# Patient Record
Sex: Male | Born: 1990 | Race: White | Hispanic: No | Marital: Married
Health system: Southern US, Community
[De-identification: ages and names within clinical notes are randomized; demographics above are authoritative.]

---

## 2021-11-01 ENCOUNTER — Other Ambulatory Visit: Payer: Self-pay | Admitting: Family Medicine

## 2021-11-01 DIAGNOSIS — R519 Headache, unspecified: Secondary | ICD-10-CM

## 2021-11-06 ENCOUNTER — Ambulatory Visit
Admission: RE | Admit: 2021-11-06 | Discharge: 2021-11-06 | Disposition: A | Discharge status: Home / Self Care | Payer: BC Managed Care – PPO | Source: Ambulatory Visit | Attending: Family Medicine | Admitting: Family Medicine

## 2021-11-06 DIAGNOSIS — R519 Headache, unspecified: Secondary | ICD-10-CM

## 2021-11-06 IMAGING — MR MR HEAD WO/W CM
13 series · 48 of 48 positions shown · IV contrast (multihance)
Comparison: None Available.

CLINICAL DATA: Non intractable headache, unspecified chronicity and
pattern; technologist note states sinus pressure, headaches, visual
disturbance

EXAM:
MRI HEAD AND ORBITS WITHOUT AND WITH CONTRAST
TECHNIQUE: Multiplanar, multiecho pulse sequences of the brain and surrounding
structures were obtained without and with intravenous contrast.
Multiplanar, multiecho pulse sequences of the orbits and surrounding
structures were obtained including fat saturation techniques, before
and after intravenous contrast administration.
CONTRAST:  14mL MULTIHANCE GADOBENATE DIMEGLUMINE 529 MG/ML IV SOLN

[Series 2: T1 · sagittal · 5.0mm · 0.45mm/px · 2 of 23 slices shown]
[im 1/23]
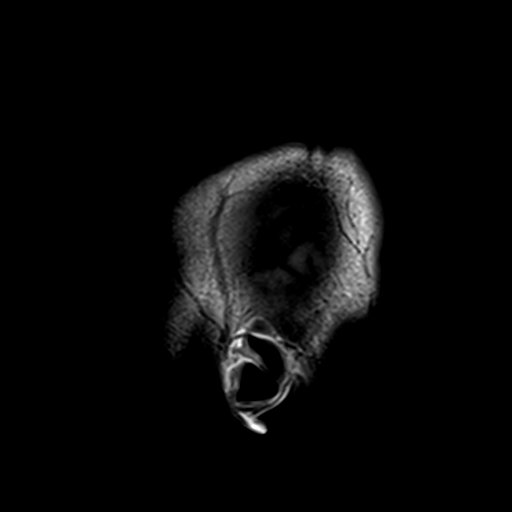
[im 23/23]
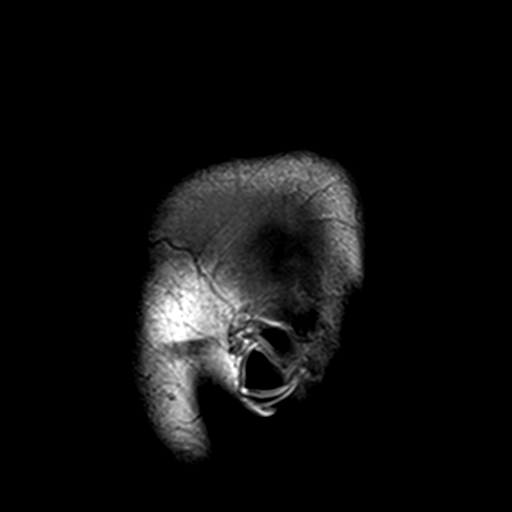

[Series 3: DWI · axial · 3.0mm · 1.80mm/px · z∈[-63,+89]mm · 7 of 103 slices shown (1 of 4)]
[im 1/103]
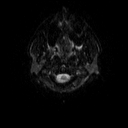
[im 18/103]
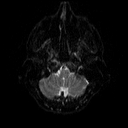
[im 35/103]
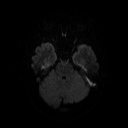
[im 52/103]
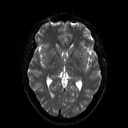
[im 69/103]
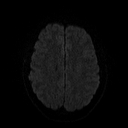
[im 86/103]
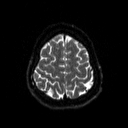
[im 103/103]
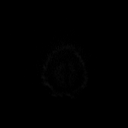

[Series 4: DWI · axial · 3.0mm · 1.80mm/px · z∈[-63,+89]mm · 3 of 52 slices shown (2 of 4)]
[im 1/52]
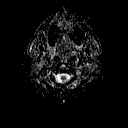
[im 26/52]
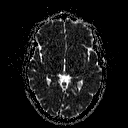
[im 52/52]
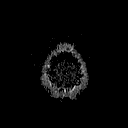

[Series 5: DWI · coronal · 5.0mm · 1.80mm/px · 5 of 76 slices shown (3 of 4)]
[im 1/76]
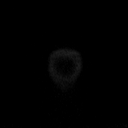
[im 19/76]
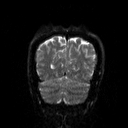
[im 38/76]
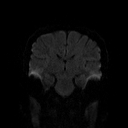
[im 57/76]
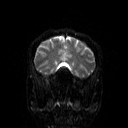
[im 76/76]
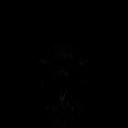

[Series 6: DWI · coronal · 5.0mm · 1.80mm/px · 2 of 38 slices shown (4 of 4)]
[im 1/38]
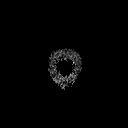
[im 38/38]
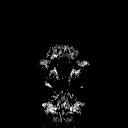

[Series 7: T2 · axial · 5.0mm · 0.72mm/px · 1 of 23 slices shown (1 of 2)]
[im 1/23]
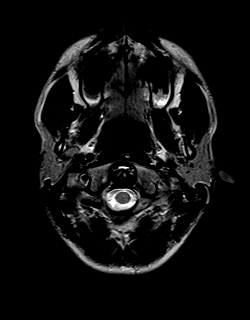

[Series 8: FLAIR · axial · 3.0mm · 0.45mm/px · z∈[-70,+88]mm · 2 of 35 slices shown (1 of 2)]
[im 1/35]
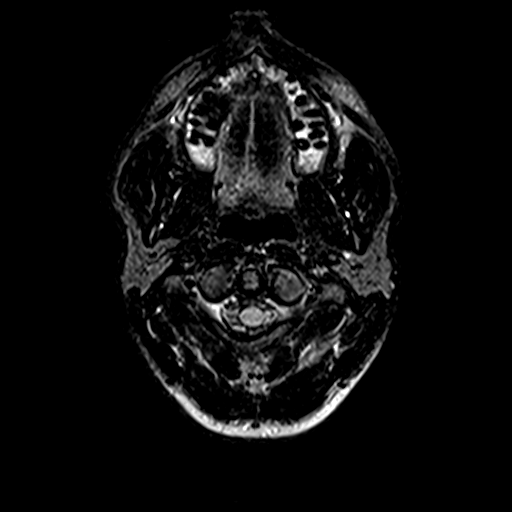
[im 35/35]
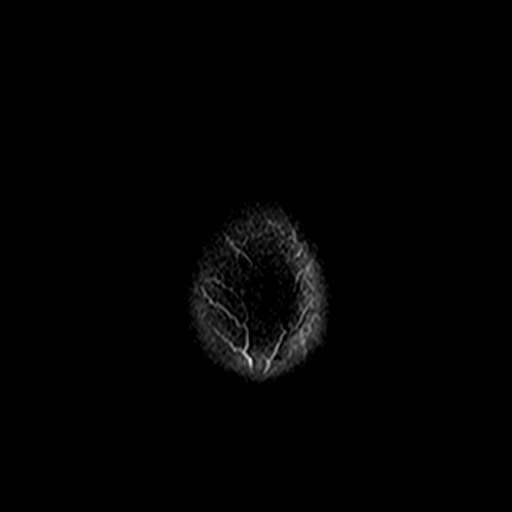

[Series 10: swi_images · axial · 4.0mm · 0.90mm/px · z∈[-61,+79]mm · 2 of 36 slices shown]
[im 1/36]
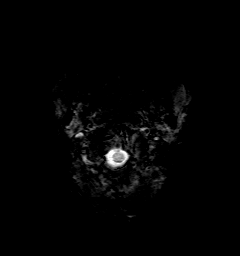
[im 36/36]
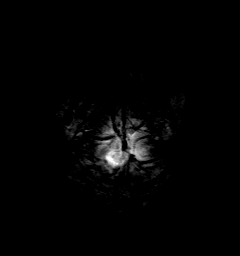

[Series 11: FLAIR · sagittal · 5.0mm · 0.45mm/px · 2 of 26 slices shown (2 of 2)]
[im 1/26]
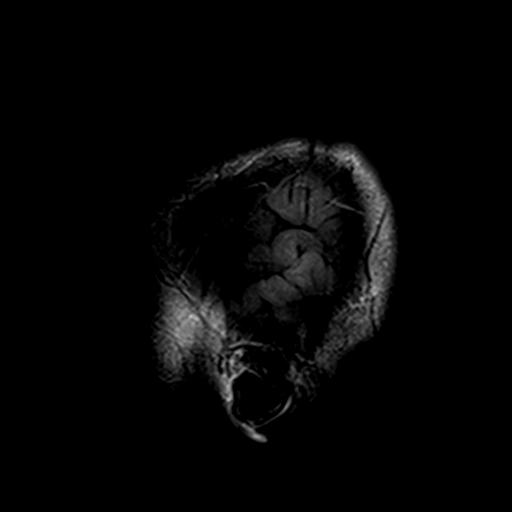
[im 26/26]
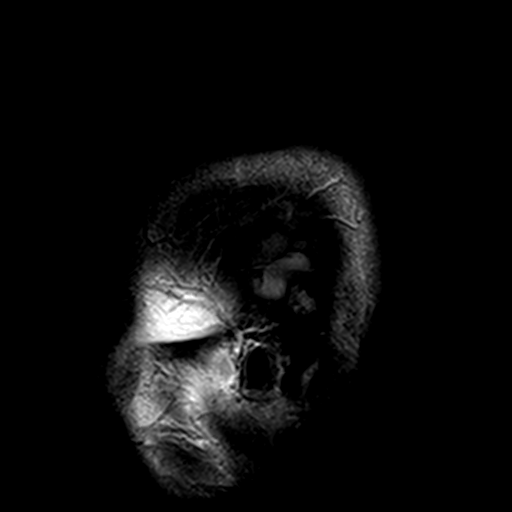

[Series 12: t1_mpr_tra · axial · 1.0mm · 0.75mm/px · z∈[-63,+80]mm · 9 of 144 slices shown]
[im 1/144]
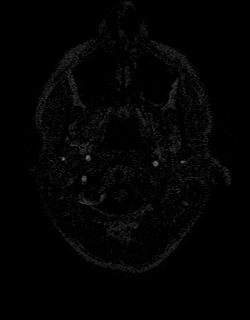
[im 18/144]
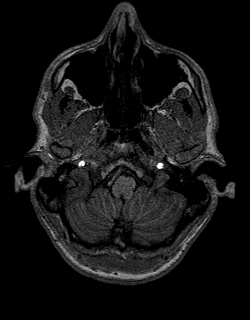
[im 36/144]
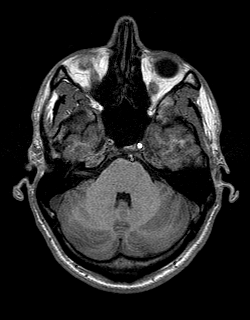
[im 54/144]
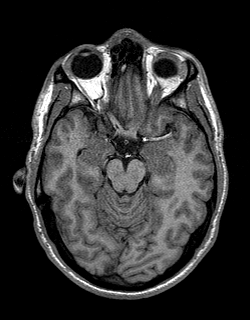
[im 72/144]
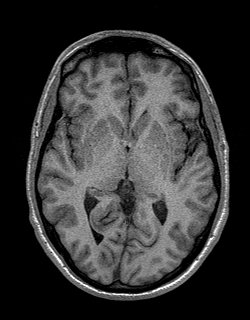
[im 90/144]
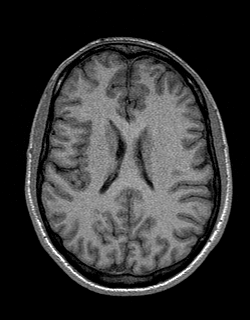
[im 108/144]
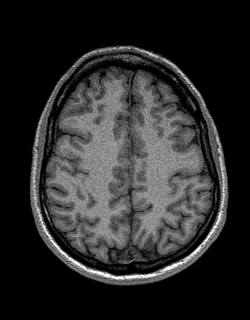
[im 126/144]
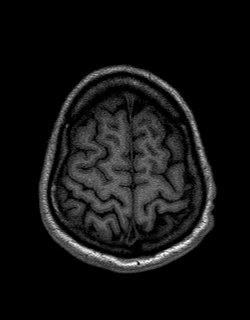
[im 144/144]
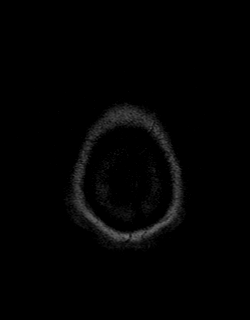

[Series 13: T2 · coronal · 5.0mm · 0.45mm/px · 2 of 29 slices shown (2 of 2)]
[im 1/29]
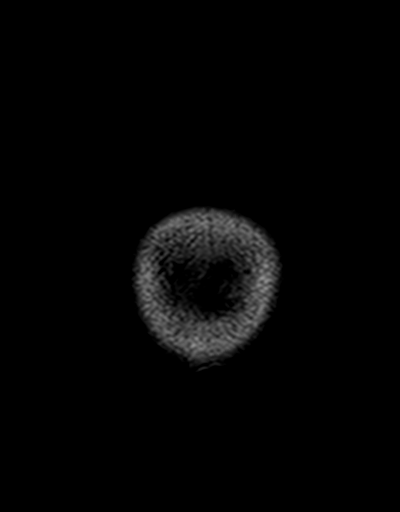
[im 29/29]
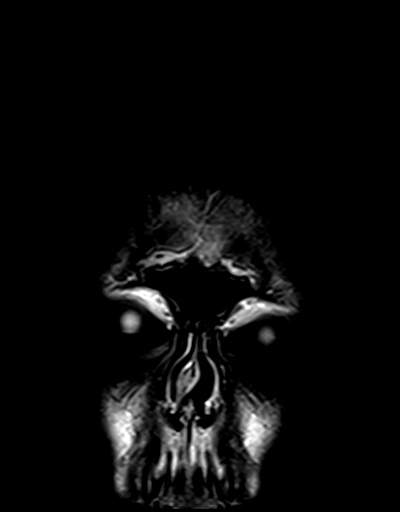

[Series 14: t1_mpr_tra post · axial · 1.0mm · 0.75mm/px · z∈[-63,+80]mm · 9 of 144 slices shown]
[im 1/144]
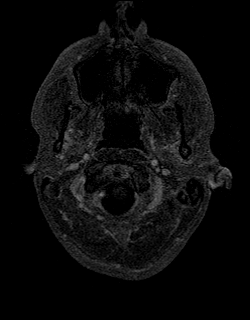
[im 18/144]
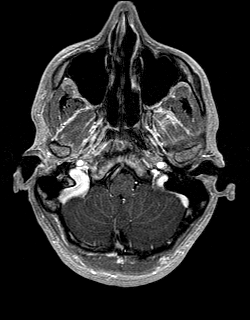
[im 36/144]
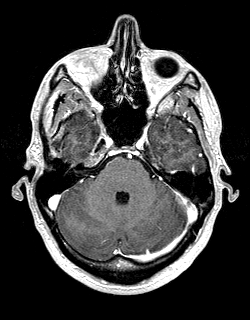
[im 54/144]
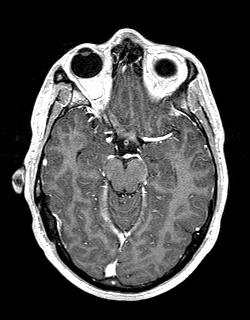
[im 72/144]
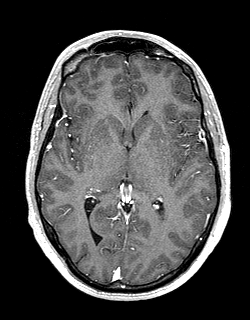
[im 90/144]
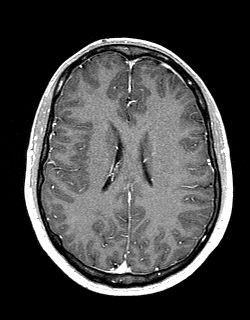
[im 108/144]
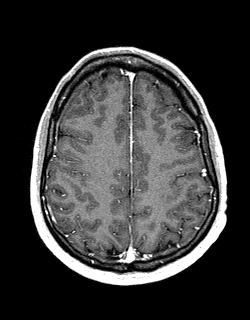
[im 126/144]
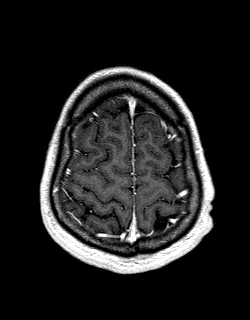
[im 144/144]
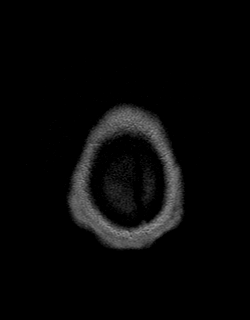

[Series 15: post cor · coronal · 5.0mm · 0.45mm/px · 2 of 29 slices shown]
[im 1/29]
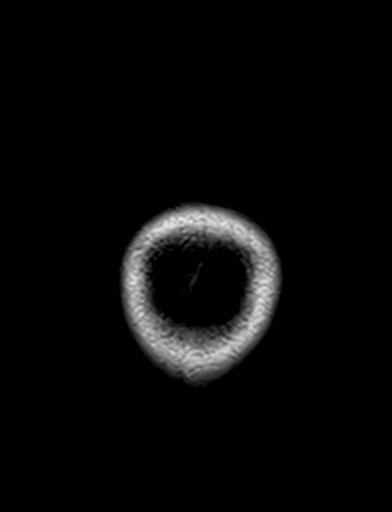
[im 29/29]
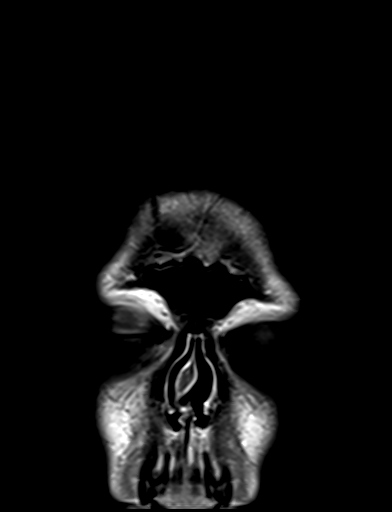

[48 of 48 positions shown; findings below may reference images not displayed]

FINDINGS: MRI HEAD FINDINGS

Brain: No acute infarction or hemorrhage. Ventricles and sulci are
within normal limits in size and configuration. Minimal foci of T2
hyperintensity in the cerebral white matter likely reflecting
nonspecific gliosis/demyelination of doubtful significance. There is
no intracranial mass, mass effect, edema, hydrocephalus, or
extra-axial collection. No abnormal enhancement.

Vascular: Major vessel flow voids at the skull base are preserved.

Skull and upper cervical spine: Marrow signal within normal limits.

Other: Sella is partially empty but not expanded. Mastoid air cells
are clear.

MRI ORBITS FINDINGS

Orbits: No proptosis. No intraorbital mass. Globes, extraocular
muscles, and lacrimal glands are symmetric and unremarkable. There
is no abnormal enhancement of the optic nerve sheath complexes.

Visualized sinuses: Trace mucosal thickening.

Soft tissues: Unremarkable.
IMPRESSION: No acute or significant abnormality.

## 2021-11-06 IMAGING — MR MR ORBITS WO/W CM
4 of 6 series · 19 of 48 positions shown · IV contrast (multihance)
Comparison: None Available.

CLINICAL DATA: Non intractable headache, unspecified chronicity and
pattern; technologist note states sinus pressure, headaches, visual
disturbance

EXAM:
MRI HEAD AND ORBITS WITHOUT AND WITH CONTRAST
TECHNIQUE: Multiplanar, multiecho pulse sequences of the brain and surrounding
structures were obtained without and with intravenous contrast.
Multiplanar, multiecho pulse sequences of the orbits and surrounding
structures were obtained including fat saturation techniques, before
and after intravenous contrast administration.
CONTRAST:  14mL MULTIHANCE GADOBENATE DIMEGLUMINE 529 MG/ML IV SOLN

[Series 1: T1 · coronal · 3.0mm · 0.35mm/px · 3 of 28 slices shown (1 of 2)]
[im 3/28]
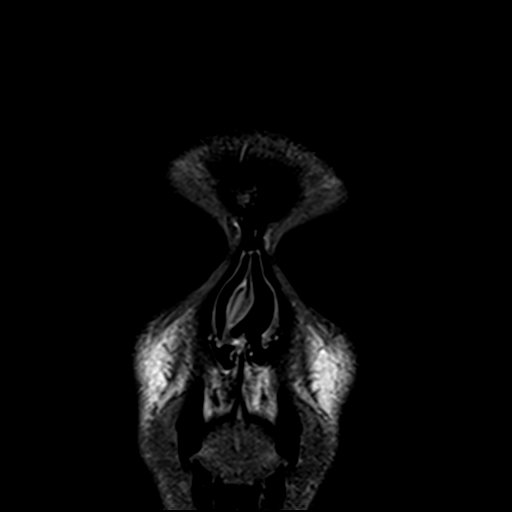
[im 14/28]
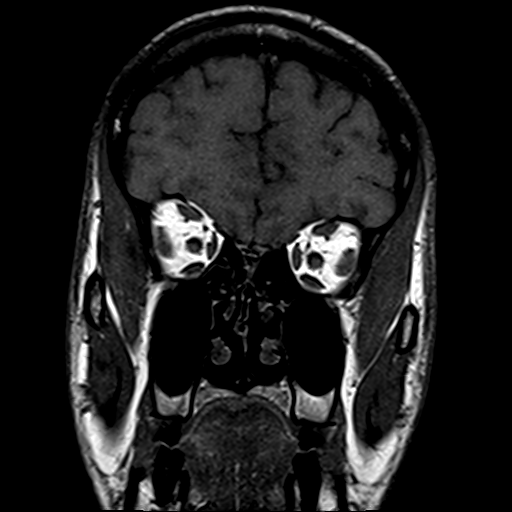
[im 25/28]
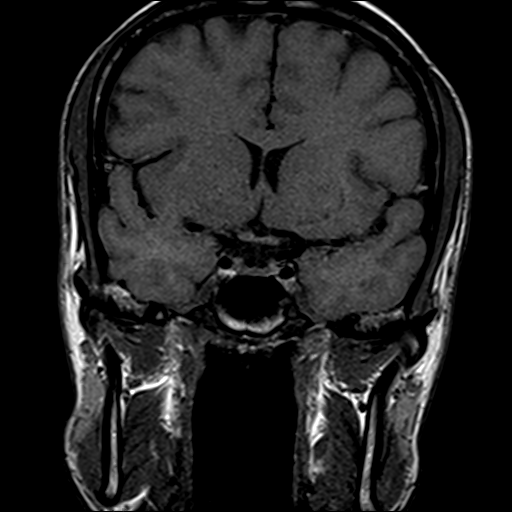

[Series 2: T1 · axial · 3.0mm · 0.35mm/px · z∈[-39,+7]mm · 3 of 18 slices shown (2 of 2)]
[im 4/18]
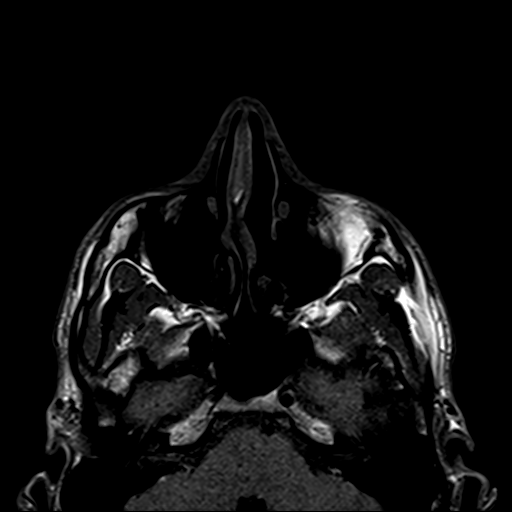
[im 11/18]
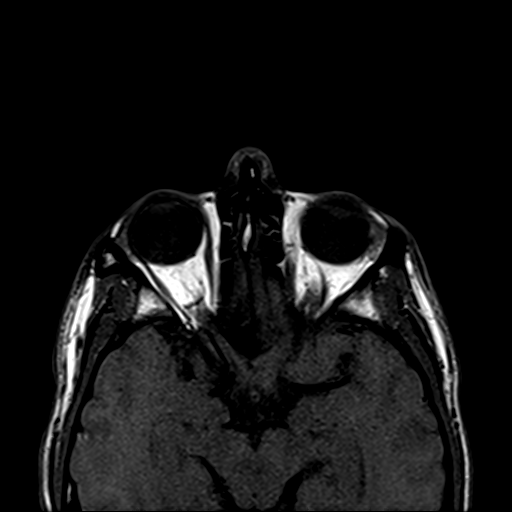
[im 18/18]
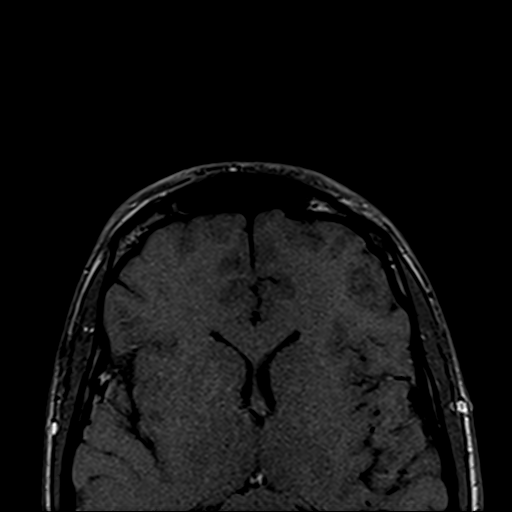

[Series 3: T2 fat-sat · coronal · 3.0mm · 0.35mm/px · 9 of 27 slices shown (1 of 2)]
[im 1/27]
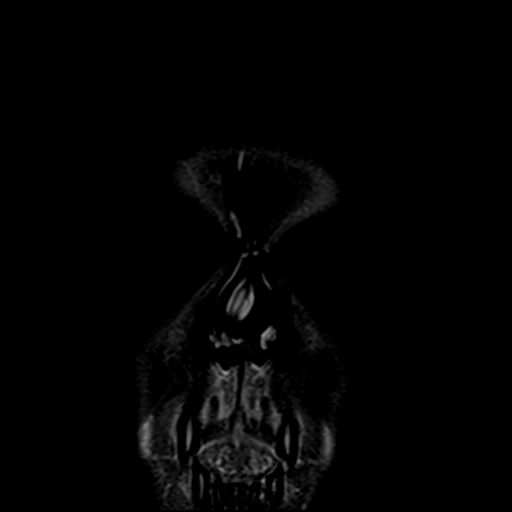
[im 4/27]
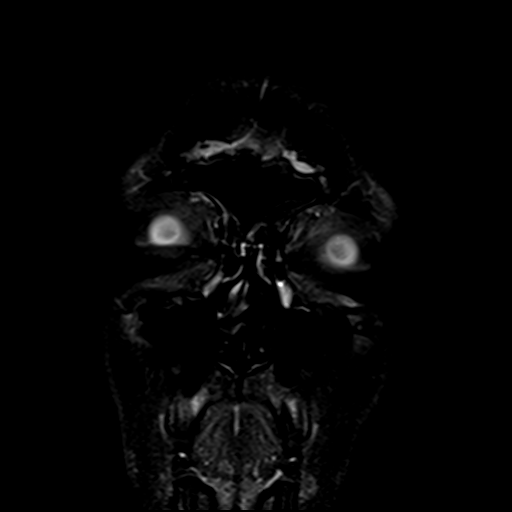
[im 7/27]
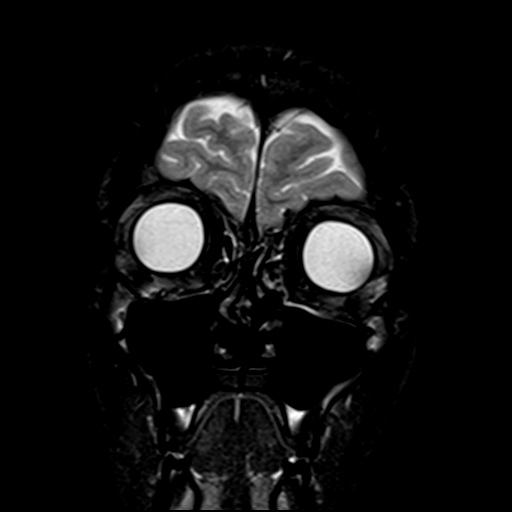
[im 10/27]
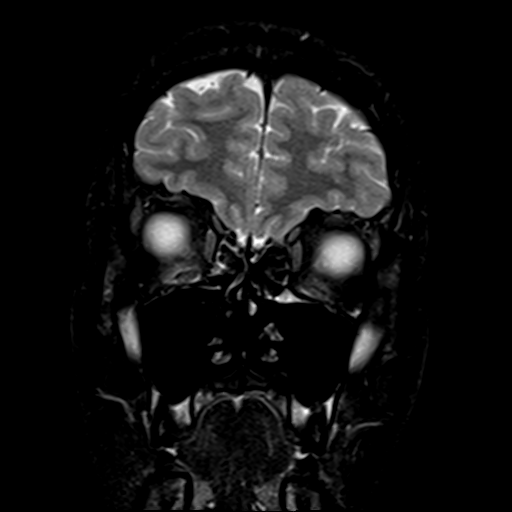
[im 14/27]
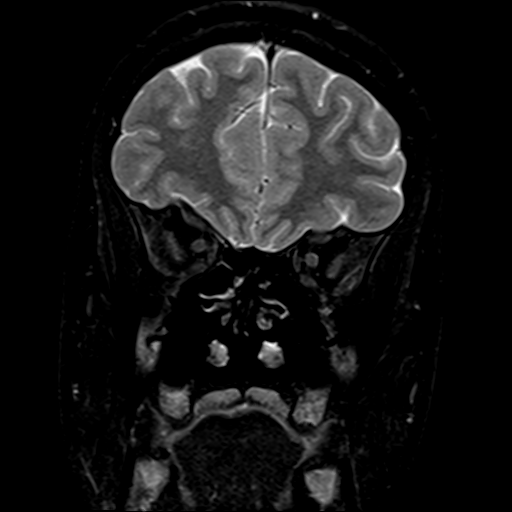
[im 17/27]
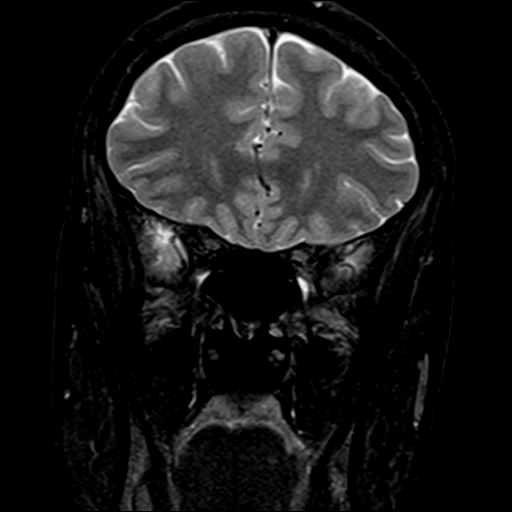
[im 20/27]
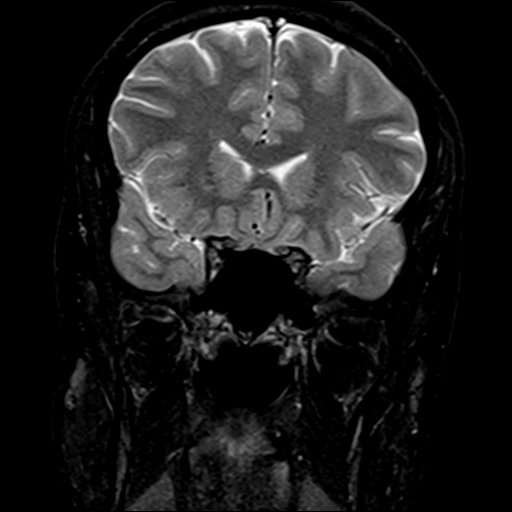
[im 23/27]
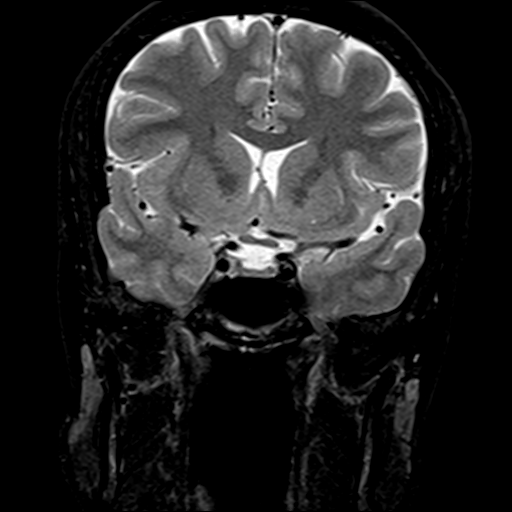
[im 27/27]
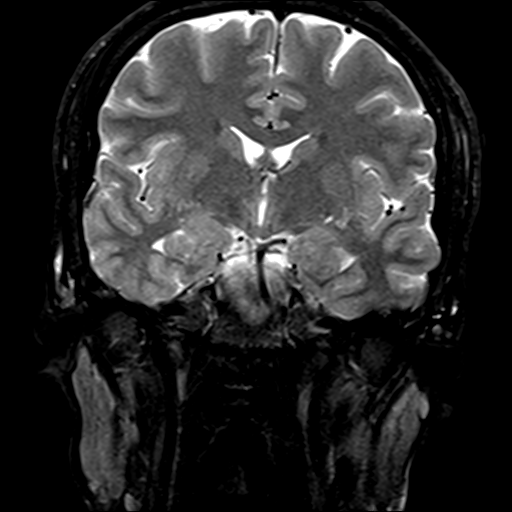

[Series 4: T2 fat-sat · axial · 3.0mm · 0.35mm/px · z∈[-49,+7]mm · 4 of 18 slices shown (2 of 2)]
[im 1/18]
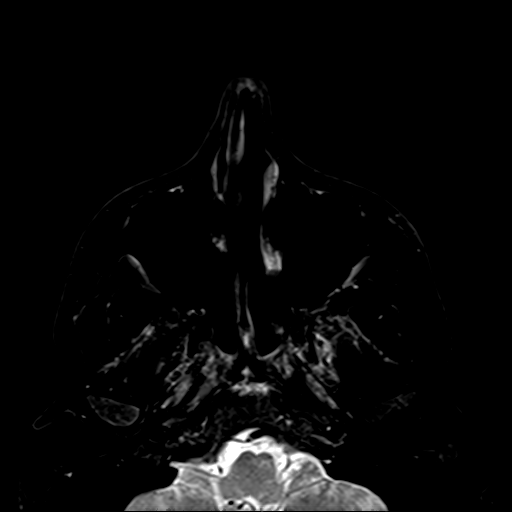
[im 4/18]
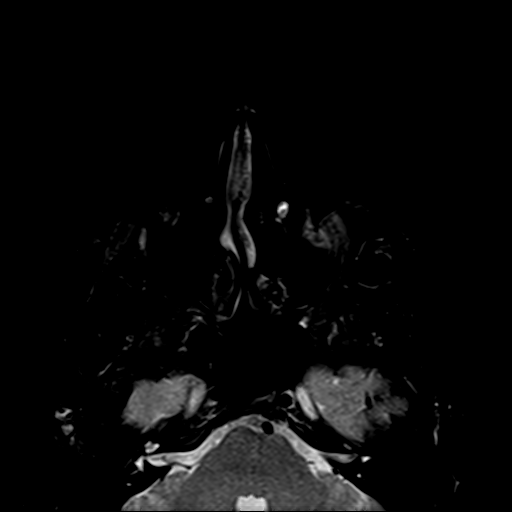
[im 11/18]
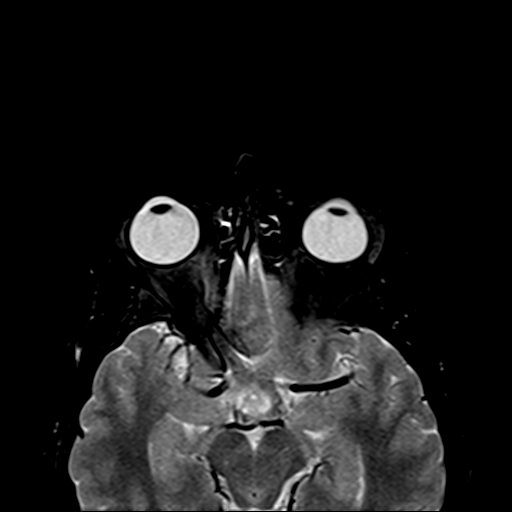
[im 18/18]
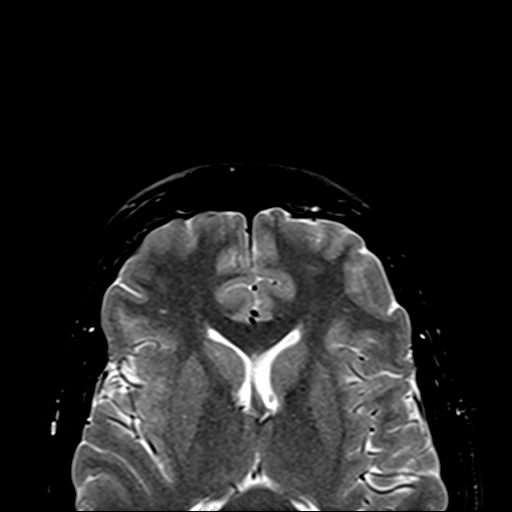

[19 of 48 positions shown; findings below may reference images not displayed]

FINDINGS: MRI HEAD FINDINGS

Brain: No acute infarction or hemorrhage. Ventricles and sulci are
within normal limits in size and configuration. Minimal foci of T2
hyperintensity in the cerebral white matter likely reflecting
nonspecific gliosis/demyelination of doubtful significance. There is
no intracranial mass, mass effect, edema, hydrocephalus, or
extra-axial collection. No abnormal enhancement.

Vascular: Major vessel flow voids at the skull base are preserved.

Skull and upper cervical spine: Marrow signal within normal limits.

Other: Sella is partially empty but not expanded. Mastoid air cells
are clear.

MRI ORBITS FINDINGS

Orbits: No proptosis. No intraorbital mass. Globes, extraocular
muscles, and lacrimal glands are symmetric and unremarkable. There
is no abnormal enhancement of the optic nerve sheath complexes.

Visualized sinuses: Trace mucosal thickening.

Soft tissues: Unremarkable.
IMPRESSION: No acute or significant abnormality.

## 2021-11-06 MED ORDER — GADOBENATE DIMEGLUMINE 529 MG/ML IV SOLN
14.0000 mL | Freq: Once | INTRAVENOUS | Status: AC | PRN
Start: 2021-11-06 — End: 2021-11-06
  Administered 2021-11-06: 14 mL via INTRAVENOUS

## 2021-11-07 ENCOUNTER — Telehealth: Payer: Self-pay | Admitting: Neurology

## 2021-11-07 NOTE — Telephone Encounter (Signed)
Dr. Reola Calkins called for you, states you spoke to him last week and you advised this patient get MRIs. He called at 8:16am and I was paged instead of it coming through the office staff. Symptoms: Sharp pain in the left eye and then swelling and sinus pressure and vision symptoms. Also had some vision changes in his left eye and then his right eye, saw optometrist. Patient is improving, Dr. Reola Calkins wanted to let you know the MRIs are complete and any other guidance you had. I told him that we would likely need to see patient in the office for an appointment before commenting on anything (Jerry Reyes has not been seen in our practice and does not have an upcoming appointment) but I would let you know,  if you would call Dr. Reola Calkins back he would appreciate it:  [phone] Dr. Lillia Pauls
